# Patient Record
Sex: Male | Born: 1985 | Race: White | Hispanic: No | Marital: Married | Smoking: Current every day smoker
Health system: Southern US, Community
[De-identification: ages and names within clinical notes are randomized; demographics above are authoritative.]

---

## 2003-10-13 ENCOUNTER — Emergency Department (HOSPITAL_COMMUNITY): Admission: EM | Admit: 2003-10-13 | Discharge: 2003-10-14 | Payer: Self-pay | Admitting: Emergency Medicine

## 2012-01-28 ENCOUNTER — Emergency Department (HOSPITAL_COMMUNITY): Payer: Medicaid Other

## 2012-01-28 ENCOUNTER — Emergency Department (HOSPITAL_COMMUNITY)
Admission: EM | Admit: 2012-01-28 | Discharge: 2012-01-29 | Disposition: A | Discharge status: Home / Self Care | Payer: Medicaid Other | Attending: Emergency Medicine | Admitting: Emergency Medicine

## 2012-01-28 ENCOUNTER — Encounter (HOSPITAL_COMMUNITY): Payer: Self-pay | Admitting: *Deleted

## 2012-01-28 DIAGNOSIS — F17200 Nicotine dependence, unspecified, uncomplicated: Secondary | ICD-10-CM | POA: Insufficient documentation

## 2012-01-28 DIAGNOSIS — S43109A Unspecified dislocation of unspecified acromioclavicular joint, initial encounter: Secondary | ICD-10-CM | POA: Insufficient documentation

## 2012-01-28 DIAGNOSIS — W1789XA Other fall from one level to another, initial encounter: Secondary | ICD-10-CM | POA: Insufficient documentation

## 2012-01-28 MED ORDER — HYDROCODONE-ACETAMINOPHEN 5-325 MG PO TABS
1.0000 | ORAL_TABLET | Freq: Once | ORAL | Status: AC
  Administered 2012-01-29: 1 via ORAL
  Filled 2012-01-28: qty 1

## 2012-01-28 MED ORDER — HYDROCODONE-ACETAMINOPHEN 5-325 MG PO TABS: 1.0000 | ORAL_TABLET | ORAL | Status: AC | PRN
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
## 2012-01-28 NOTE — ED Notes (Signed)
Patient transported to X-ray 
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
## 2012-01-29 NOTE — ED Provider Notes (Signed)
Medical screening examination/treatment/procedure(s) were performed by non-physician practitioner and as supervising physician I was immediately available for consultation/collaboration.  Sunnie Nielsen, MD 01/29/12 [phone]

## 2012-01-29 NOTE — ED Notes (Signed)
Pt ambulated with a steady gait;VSS; A&Ox3; no signs of distress; respirations even and unlabored; skin warm and dry; no questions at this time.  

## 2012-12-30 ENCOUNTER — Emergency Department (HOSPITAL_BASED_OUTPATIENT_CLINIC_OR_DEPARTMENT_OTHER)
Admission: EM | Admit: 2012-12-30 | Discharge: 2012-12-30 | Disposition: A | Discharge status: Home / Self Care | Payer: Self-pay | Attending: Emergency Medicine | Admitting: Emergency Medicine

## 2012-12-30 ENCOUNTER — Emergency Department (HOSPITAL_BASED_OUTPATIENT_CLINIC_OR_DEPARTMENT_OTHER): Payer: Self-pay

## 2012-12-30 ENCOUNTER — Encounter (HOSPITAL_BASED_OUTPATIENT_CLINIC_OR_DEPARTMENT_OTHER): Payer: Self-pay | Admitting: *Deleted

## 2012-12-30 DIAGNOSIS — S60229A Contusion of unspecified hand, initial encounter: Secondary | ICD-10-CM | POA: Insufficient documentation

## 2012-12-30 DIAGNOSIS — IMO0002 Reserved for concepts with insufficient information to code with codable children: Secondary | ICD-10-CM | POA: Insufficient documentation

## 2012-12-30 DIAGNOSIS — Z79899 Other long term (current) drug therapy: Secondary | ICD-10-CM | POA: Insufficient documentation

## 2012-12-30 DIAGNOSIS — S60221A Contusion of right hand, initial encounter: Secondary | ICD-10-CM

## 2012-12-30 DIAGNOSIS — Y9389 Activity, other specified: Secondary | ICD-10-CM | POA: Insufficient documentation

## 2012-12-30 DIAGNOSIS — T07XXXA Unspecified multiple injuries, initial encounter: Secondary | ICD-10-CM | POA: Insufficient documentation

## 2012-12-30 DIAGNOSIS — F17200 Nicotine dependence, unspecified, uncomplicated: Secondary | ICD-10-CM | POA: Insufficient documentation

## 2012-12-30 DIAGNOSIS — Y9289 Other specified places as the place of occurrence of the external cause: Secondary | ICD-10-CM | POA: Insufficient documentation

## 2012-12-30 MED ORDER — OXYCODONE-ACETAMINOPHEN 5-325 MG PO TABS: 1.0000 | ORAL_TABLET | ORAL | Status: DC | PRN

## 2012-12-30 MED ORDER — OXYCODONE-ACETAMINOPHEN 5-325 MG PO TABS
1.0000 | ORAL_TABLET | Freq: Once | ORAL | Status: AC
  Administered 2012-12-30: 1 via ORAL
  Filled 2012-12-30 (×2): qty 1
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
## 2012-12-30 NOTE — ED Notes (Signed)
Splint applied by ED tech pt states pain to wrist improved
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
## 2013-01-28 ENCOUNTER — Encounter (HOSPITAL_BASED_OUTPATIENT_CLINIC_OR_DEPARTMENT_OTHER): Payer: Self-pay | Admitting: *Deleted

## 2013-01-28 ENCOUNTER — Emergency Department (HOSPITAL_BASED_OUTPATIENT_CLINIC_OR_DEPARTMENT_OTHER)
Admission: EM | Admit: 2013-01-28 | Discharge: 2013-01-28 | Disposition: A | Discharge status: Home / Self Care | Payer: Self-pay | Attending: Emergency Medicine | Admitting: Emergency Medicine

## 2013-01-28 DIAGNOSIS — Z79899 Other long term (current) drug therapy: Secondary | ICD-10-CM | POA: Insufficient documentation

## 2013-01-28 DIAGNOSIS — K029 Dental caries, unspecified: Secondary | ICD-10-CM | POA: Insufficient documentation

## 2013-01-28 DIAGNOSIS — F17200 Nicotine dependence, unspecified, uncomplicated: Secondary | ICD-10-CM | POA: Insufficient documentation

## 2013-01-28 MED ORDER — PENICILLIN V POTASSIUM 250 MG PO TABS
500.0000 mg | ORAL_TABLET | Freq: Once | ORAL | Status: AC
  Administered 2013-01-28: 500 mg via ORAL
  Filled 2013-01-28: qty 2

## 2013-01-28 MED ORDER — PENICILLIN V POTASSIUM 500 MG PO TABS: 500.0000 mg | ORAL_TABLET | Freq: Four times a day (QID) | ORAL | Status: DC

## 2013-01-28 MED ORDER — OXYCODONE-ACETAMINOPHEN 5-325 MG PO TABS
1.0000 | ORAL_TABLET | Freq: Once | ORAL | Status: AC
  Administered 2013-01-28: 1 via ORAL
  Filled 2013-01-28 (×2): qty 1

## 2013-01-28 MED ORDER — OXYCODONE-ACETAMINOPHEN 5-325 MG PO TABS: 1.0000 | ORAL_TABLET | Freq: Four times a day (QID) | ORAL | Status: DC | PRN

## 2013-01-28 NOTE — ED Provider Notes (Signed)
History    CSN: [id number] Arrival date & time 01/28/13  0204  First MD Initiated Contact with Patient 01/28/13 0210     Chief Complaint  Patient presents with  . Dental Pain   (Consider location/radiation/quality/duration/timing/severity/associated sxs/prior Treatment) Patient is a 27 y.o. male presenting with tooth pain. The history is provided by the patient. No language interpreter was used.  Dental Pain Location:  Lower Lower teeth location:  21/LL 1st bicuspid, 20/LL 2nd bicuspid, 19/LL 1st molar and 17/LL 3rd molar Quality:  Dull Severity:  Moderate Onset quality:  Gradual Timing:  Constant Progression:  Worsening Chronicity:  Recurrent Context: dental fracture and poor dentition   Previous work-up:  Filled cavity Relieved by:  Nothing Worsened by:  Nothing tried Ineffective treatments:  None tried Associated symptoms: no fever   Risk factors: no diabetes    History reviewed. No pertinent past medical history. History reviewed. No pertinent past surgical history. No family history on file. History  Substance Use Topics  . Smoking status: Current Every Day Smoker  . Smokeless tobacco: Not on file  . Alcohol Use: No    Review of Systems  Constitutional: Negative for fever.  All other systems reviewed and are negative.    Allergies  Review of patient's allergies indicates no active allergies.  Home Medications   Current Outpatient Rx  Name  Route  Sig  Dispense  Refill  . BusPIRone HCl (BUSPAR PO)   Oral   Take 1 tablet by mouth 4 (four) times daily - after meals and at bedtime.         Marland Kitchen oxyCODONE-acetaminophen (PERCOCET) 5-325 MG per tablet   Oral   Take 1 tablet by mouth every 6 (six) hours as needed for pain.   10 tablet   0   . oxyCODONE-acetaminophen (PERCOCET/ROXICET) 5-325 MG per tablet   Oral   Take 1 tablet by mouth every 4 (four) hours as needed for pain.   20 tablet   0   . penicillin v potassium (VEETID) 500 MG tablet   Oral   Take 1 tablet (500 mg total) by mouth 4 (four) times daily.   40 tablet   0   . QUEtiapine (SEROQUEL) 25 MG tablet   Oral   Take 25 mg by mouth at bedtime.          BP 155/95  Pulse 87  Temp(Src) 98.1 F (36.7 C) (Oral)  Ht 5\' 10"  (1.778 m)  Wt 190 lb (86.183 kg)  BMI 27.26 kg/m2  SpO2 97% Physical Exam  Constitutional: He is oriented to person, place, and time. He appears well-developed and well-nourished. No distress.  HENT:  Head: Normocephalic and atraumatic.  Mouth/Throat: Oropharynx is clear and moist.    Eyes: Conjunctivae are normal. Pupils are equal, round, and reactive to light.  Neck: Normal range of motion. Neck supple.  Cardiovascular: Normal rate, regular rhythm and intact distal pulses.   Pulmonary/Chest: Effort normal and breath sounds normal. He has no wheezes. He has no rales.  Abdominal: Soft. Bowel sounds are normal. There is no tenderness. There is no rebound and no guarding.  Musculoskeletal: Normal range of motion.  Neurological: He is alert and oriented to person, place, and time.  Skin: Skin is warm and dry.  Psychiatric: He has a normal mood and affect.    ED Course  Procedures (including critical care time) Labs Reviewed - No data to display No results found. 1. Dental caries     MDM  Antibiotics, pain meds follow up for extractions  Marsela Kuan K Ardell Makarewicz-Rasch, MD 01/28/13 [id number]

## 2013-01-28 NOTE — ED Notes (Addendum)
C/o right lower tooth pain that started last night after eating. States he "cracked" his tooth. Tooth decay noted on right lower 5  teeth.  Fevers unknown.

## 2013-05-05 ENCOUNTER — Emergency Department (HOSPITAL_BASED_OUTPATIENT_CLINIC_OR_DEPARTMENT_OTHER)
Admission: EM | Admit: 2013-05-05 | Discharge: 2013-05-05 | Disposition: A | Discharge status: Home / Self Care | Payer: Self-pay | Attending: Emergency Medicine | Admitting: Emergency Medicine

## 2013-05-05 ENCOUNTER — Encounter (HOSPITAL_BASED_OUTPATIENT_CLINIC_OR_DEPARTMENT_OTHER): Payer: Self-pay | Admitting: Emergency Medicine

## 2013-05-05 ENCOUNTER — Emergency Department (HOSPITAL_BASED_OUTPATIENT_CLINIC_OR_DEPARTMENT_OTHER): Payer: Medicaid Other

## 2013-05-05 DIAGNOSIS — M549 Dorsalgia, unspecified: Secondary | ICD-10-CM

## 2013-05-05 DIAGNOSIS — R350 Frequency of micturition: Secondary | ICD-10-CM | POA: Insufficient documentation

## 2013-05-05 DIAGNOSIS — R300 Dysuria: Secondary | ICD-10-CM | POA: Insufficient documentation

## 2013-05-05 DIAGNOSIS — M545 Low back pain, unspecified: Secondary | ICD-10-CM | POA: Insufficient documentation

## 2013-05-05 DIAGNOSIS — F17200 Nicotine dependence, unspecified, uncomplicated: Secondary | ICD-10-CM | POA: Insufficient documentation

## 2013-05-05 DIAGNOSIS — R319 Hematuria, unspecified: Secondary | ICD-10-CM | POA: Insufficient documentation

## 2013-05-05 LAB — URINE MICROSCOPIC-ADD ON

## 2013-05-05 LAB — URINALYSIS, ROUTINE W REFLEX MICROSCOPIC
Bilirubin Urine: NEGATIVE
Glucose, UA: NEGATIVE mg/dL
Ketones, ur: NEGATIVE mg/dL
Leukocytes, UA: NEGATIVE
Nitrite: NEGATIVE
Protein, ur: NEGATIVE mg/dL
Specific Gravity, Urine: 1.013 (ref 1.005–1.030)
Urobilinogen, UA: 0.2 mg/dL (ref 0.0–1.0)
pH: 6 (ref 5.0–8.0)

## 2013-05-05 MED ORDER — ONDANSETRON HCL 4 MG/2ML IJ SOLN: 4.0000 mg | Freq: Once | INTRAMUSCULAR | Status: DC

## 2013-05-05 MED ORDER — OXYCODONE-ACETAMINOPHEN 5-325 MG PO TABS
1.0000 | ORAL_TABLET | Freq: Once | ORAL | Status: AC
  Administered 2013-05-05: 1 via ORAL
  Filled 2013-05-05: qty 1

## 2013-05-05 MED ORDER — ONDANSETRON 8 MG PO TBDP
8.0000 mg | ORAL_TABLET | Freq: Once | ORAL | Status: AC
  Administered 2013-05-05: 8 mg via ORAL
  Filled 2013-05-05: qty 1

## 2013-05-05 MED ORDER — METHOCARBAMOL 500 MG PO TABS: 500.0000 mg | ORAL_TABLET | Freq: Four times a day (QID) | ORAL | Status: AC | PRN

## 2013-05-05 MED ORDER — MORPHINE SULFATE 4 MG/ML IJ SOLN: 4.0000 mg | Freq: Once | INTRAMUSCULAR | Status: DC

## 2013-05-05 NOTE — ED Provider Notes (Signed)
Medical screening examination/treatment/procedure(s) were performed by non-physician practitioner and as supervising physician I was immediately available for consultation/collaboration.  EKG Interpretation   None         Dustin Lee. Oletta Lamas, MD 05/05/13 2007

## 2013-05-05 NOTE — ED Provider Notes (Signed)
CSN: [id number]     Arrival date & time 05/05/13  1739 History   First MD Initiated Contact with Patient 05/05/13 1811     Chief Complaint  Patient presents with  . Back Pain   (Consider location/radiation/quality/duration/timing/severity/associated sxs/prior Treatment) HPI Pt reports bilateral lower back pain that began yesterday and burning with urination and urinary urgency that began today.  Notes dark urine but denies hematuria.  Denies fevers, chills, body aches, abdominal pain, rectal pain, changes in bowel habit, testicular pain or swelling.  Has had these symptoms previously and was told he had a UTI.  Denies penile discharge or any possibility of STD.    History reviewed. No pertinent past medical history. History reviewed. No pertinent past surgical history. No family history on file. History  Substance Use Topics  . Smoking status: Current Every Day Smoker -- 1.00 packs/day    Types: Cigarettes  . Smokeless tobacco: Not on file  . Alcohol Use: No    Review of Systems  Constitutional: Negative for fever and chills.  Respiratory: Negative for cough and shortness of breath.   Cardiovascular: Negative for chest pain.  Gastrointestinal: Negative for nausea, vomiting, abdominal pain and diarrhea.  Genitourinary: Positive for dysuria and frequency. Negative for urgency.  Musculoskeletal: Positive for back pain. Negative for myalgias.  Skin: Negative for rash.    Allergies  Review of patient's allergies indicates no active allergies.  Home Medications  No current outpatient prescriptions on file. BP 131/75  Pulse 94  Temp(Src) 98.8 F (37.1 C) (Oral)  Resp 18  Wt 180 lb (81.647 kg)  BMI 25.83 kg/m2  SpO2 99% Physical Exam  Nursing note and vitals reviewed. Constitutional: He appears well-developed and well-nourished. No distress.  HENT:  Head: Normocephalic and atraumatic.  Neck: Neck supple.  Cardiovascular: Normal rate and regular rhythm.   Pulmonary/Chest:  Effort normal and breath sounds normal. No respiratory distress. He has no wheezes. He has no rales.  Abdominal: Soft. He exhibits no distension and no mass. There is tenderness. There is CVA tenderness (bilateral). There is no rebound and no guarding.  Diffuse lower abdominal tenderness L>R  Musculoskeletal:       Arms: Neurological: He is alert. He exhibits normal muscle tone.  Skin: He is not diaphoretic.    ED Course  Procedures (including critical care time) Labs Review Labs Reviewed  URINALYSIS, ROUTINE W REFLEX MICROSCOPIC - Abnormal; Notable for the following:    Hgb urine dipstick LARGE (*)    All other components within normal limits  URINE MICROSCOPIC-ADD ON   Imaging Review Ct Abdomen Pelvis Wo Contrast  05/05/2013   CLINICAL DATA:  Low back pain, hematuria.  EXAM: CT ABDOMEN AND PELVIS WITHOUT CONTRAST  TECHNIQUE: Multidetector CT imaging of the abdomen and pelvis was performed following the standard protocol without intravenous contrast.  COMPARISON:  None.  FINDINGS: Visualized lung bases appear normal. The liver, spleen and pancreas appear normal. No gallstones are noted. Adrenal glands and kidneys appear normal. No hydronephrosis or renal obstruction is noted. No renal or ureteral calculi are noted. The appendix appears normal. No evidence of bowel obstruction is noted. No abnormal fluid collection is noted. Urinary bladder appears normal.  IMPRESSION: No acute abnormality seen in the abdomen or pelvis.   Electronically Signed   By: Roque Lias M.D.   On: 05/05/2013 19:07    EKG Interpretation   None      Declines STD testing.   MDM   1. Back pain  2. Hematuria    Pt with low back pain and hematuria that began last night.  Pt is tender throughout low and mid back, worse with movement, reproducible with palpation.  Hematuria from unknown etiology.  No e/o stone.  Pt d/c home with urology f/u.  Robaxin for presumed musculoskeletal back pain.  Discussed result,  findings, treatment, and follow up  with patient.  Pt given return precautions.  Pt verbalizes understanding and agrees with plan.        Trixie Dredge, PA-C 05/05/13 2005

## 2013-05-05 NOTE — ED Notes (Signed)
Patient here with lower back pain and hematuria since last pm. Reports also had decreased urination for same, no hx of stones.

## 2013-06-22 ENCOUNTER — Encounter (HOSPITAL_BASED_OUTPATIENT_CLINIC_OR_DEPARTMENT_OTHER): Payer: Self-pay | Admitting: Emergency Medicine

## 2013-06-22 ENCOUNTER — Emergency Department (HOSPITAL_BASED_OUTPATIENT_CLINIC_OR_DEPARTMENT_OTHER)
Admission: EM | Admit: 2013-06-22 | Discharge: 2013-06-22 | Disposition: A | Discharge status: Home / Self Care | Payer: Medicaid Other | Attending: Emergency Medicine | Admitting: Emergency Medicine

## 2013-06-22 DIAGNOSIS — N5089 Other specified disorders of the male genital organs: Secondary | ICD-10-CM

## 2013-06-22 DIAGNOSIS — F17200 Nicotine dependence, unspecified, uncomplicated: Secondary | ICD-10-CM | POA: Insufficient documentation

## 2013-06-22 DIAGNOSIS — N508 Other specified disorders of male genital organs: Secondary | ICD-10-CM | POA: Insufficient documentation

## 2013-06-22 LAB — GC/CHLAMYDIA PROBE AMP
CT Probe RNA: NEGATIVE
GC Probe RNA: NEGATIVE

## 2013-06-22 MED ORDER — VALACYCLOVIR HCL 1 G PO TABS: 1000.0000 mg | ORAL_TABLET | Freq: Two times a day (BID) | ORAL | Status: DC

## 2013-06-22 MED ORDER — ACYCLOVIR 400 MG PO TABS: 400.0000 mg | ORAL_TABLET | Freq: Three times a day (TID) | ORAL | Status: AC

## 2013-06-22 MED ORDER — VALACYCLOVIR HCL 500 MG PO TABS
1000.0000 mg | ORAL_TABLET | Freq: Once | ORAL | Status: DC
  Filled 2013-06-22: qty 2

## 2013-06-22 MED ORDER — HYDROCODONE-ACETAMINOPHEN 5-325 MG PO TABS
2.0000 | ORAL_TABLET | Freq: Once | ORAL | Status: AC
  Administered 2013-06-22: 2 via ORAL
  Filled 2013-06-22: qty 2

## 2013-06-22 MED ORDER — HYDROCODONE-ACETAMINOPHEN 5-325 MG PO TABS: 1.0000 | ORAL_TABLET | Freq: Four times a day (QID) | ORAL | Status: AC | PRN

## 2013-06-22 NOTE — ED Notes (Signed)
MD at bedside. 

## 2013-06-22 NOTE — ED Notes (Signed)
Pt reports yesterday noticed a 'bump' on the base of his penis, is now raw and painful with several 'bump' areas.

## 2013-06-22 NOTE — ED Provider Notes (Signed)
CSN: [id number]     Arrival date & time 06/22/13  0520 History   First MD Initiated Contact with Patient 06/22/13 0529     Chief Complaint  Patient presents with  . Penis Pain   (Consider location/radiation/quality/duration/timing/severity/associated sxs/prior Treatment) HPI This is a 27 year old male with a new sexual partner for about one month. He is here with about a 24-hour history of severely painful bumps primary at the base of his penis with a few smaller lesions near the glans. They're not vesicular. There hurting enough to keep him from sleeping. There is no associated urethral discharge, dysuria, drainage or lymphadenopathy. He has a history of herpes simplex virus. His significant other has a remote history of cold sore but no recent active lesion. He denies fever, chills, nausea, vomiting or abdominal pain.   History reviewed. No pertinent past medical history. History reviewed. No pertinent past surgical history. No family history on file. History  Substance Use Topics  . Smoking status: Current Every Day Smoker -- 1.50 packs/day    Types: Cigarettes  . Smokeless tobacco: Not on file  . Alcohol Use: No    Review of Systems  All other systems reviewed and are negative.    Allergies  Review of patient's allergies indicates no known allergies.  Home Medications   Current Outpatient Rx  Name  Route  Sig  Dispense  Refill  . methocarbamol (ROBAXIN) 500 MG tablet   Oral   Take 1 tablet (500 mg total) by mouth 4 (four) times daily as needed (pain).   20 tablet   0    BP 135/99  Temp(Src) 98.5 F (36.9 C) (Oral)  Resp 18  Ht 5\' 10"  (1.778 m)  Wt 180 lb (81.647 kg)  BMI 25.83 kg/m2  Physical Exam General: Well-developed, well-nourished male in no acute distress; appearance consistent with age of record HENT: normocephalic; atraumatic Eyes: pupils equal, round and reactive to light; extraocular muscles intact Neck: supple Heart: regular rate and  rhythm Lungs: clear to auscultation bilaterally Abdomen: soft; nondistended; nontender; no masses or hepatosplenomegaly; bowel sounds present GU: Tanner 4 male, circumcised; mild bilateral testicular tenderness without mass or swelling; several maculopapular lesions of the base of the penis which are profoundly tender, 2 crusted lesions near the glans; no inguinal lymphadenopathy Extremities: No deformity; full range of motion Neurologic: Awake, alert and oriented; motor function intact in all extremities and symmetric; no facial droop Skin: Warm and dry Psychiatric: Anxious    ED Course  Procedures (including critical care time)  MDM  Although the rash is not classic for genital herpes we will go ahead and start Valtrex as a precaution pending STD and viral cultures.     Hanley Seamen, MD 06/22/13 [phone]

## 2013-06-24 ENCOUNTER — Encounter (HOSPITAL_BASED_OUTPATIENT_CLINIC_OR_DEPARTMENT_OTHER): Payer: Self-pay | Admitting: Emergency Medicine

## 2013-06-24 ENCOUNTER — Emergency Department (HOSPITAL_BASED_OUTPATIENT_CLINIC_OR_DEPARTMENT_OTHER)
Admission: EM | Admit: 2013-06-24 | Discharge: 2013-06-24 | Disposition: A | Discharge status: Home / Self Care | Payer: Medicaid Other | Attending: Emergency Medicine | Admitting: Emergency Medicine

## 2013-06-24 DIAGNOSIS — Z79899 Other long term (current) drug therapy: Secondary | ICD-10-CM | POA: Insufficient documentation

## 2013-06-24 DIAGNOSIS — F17200 Nicotine dependence, unspecified, uncomplicated: Secondary | ICD-10-CM | POA: Insufficient documentation

## 2013-06-24 DIAGNOSIS — N489 Disorder of penis, unspecified: Secondary | ICD-10-CM

## 2013-06-24 DIAGNOSIS — N4889 Other specified disorders of penis: Secondary | ICD-10-CM | POA: Insufficient documentation

## 2013-06-24 DIAGNOSIS — T7840XA Allergy, unspecified, initial encounter: Secondary | ICD-10-CM

## 2013-06-24 DIAGNOSIS — T4275XA Adverse effect of unspecified antiepileptic and sedative-hypnotic drugs, initial encounter: Secondary | ICD-10-CM | POA: Insufficient documentation

## 2013-06-24 DIAGNOSIS — R21 Rash and other nonspecific skin eruption: Secondary | ICD-10-CM | POA: Insufficient documentation

## 2013-06-24 LAB — URINALYSIS, ROUTINE W REFLEX MICROSCOPIC
Glucose, UA: NEGATIVE mg/dL
Leukocytes, UA: NEGATIVE
pH: 7 (ref 5.0–8.0)

## 2013-06-24 MED ORDER — MELOXICAM 7.5 MG PO TABS: 7.5000 mg | ORAL_TABLET | Freq: Every day | ORAL | Status: AC

## 2013-06-24 MED ORDER — DIPHENHYDRAMINE HCL 25 MG PO CAPS
25.0000 mg | ORAL_CAPSULE | Freq: Once | ORAL | Status: AC
  Administered 2013-06-24: 25 mg via ORAL
  Filled 2013-06-24: qty 1

## 2013-06-24 MED ORDER — KETOROLAC TROMETHAMINE 10 MG PO TABS
10.0000 mg | ORAL_TABLET | Freq: Once | ORAL | Status: AC
  Administered 2013-06-24: 10 mg via ORAL
  Filled 2013-06-24: qty 1

## 2013-06-24 NOTE — ED Provider Notes (Signed)
No leukocytes on urinalysis to suggest UTI or epidydymitis.  Patient sleeping in room on multiple evals.  Patient belligerent at discharge stating, " First you all tell me I'm allergic to codeine.  Then you tell me I'm allergic to tylenol, now you're giving me pain pills with tylenol and I can't sleep."  EDP explained he had not been told by anyone that he was allergic to Tylenol in this ED and that [facility] stated he had not been even registered at their Hospital as a patient today or since October.  Informed by Vivi Martens, nurse, that patient refused to sign because what he wanted with [facility].    Jasmine Awe, MD 06/24/13 [phone]

## 2013-06-24 NOTE — ED Provider Notes (Signed)
CSN: [id number]     Arrival date & time 06/24/13  0142 History   First MD Initiated Contact with Patient 06/24/13 [phone]     Chief Complaint  Patient presents with  . Allergic Reaction   (Consider location/radiation/quality/duration/timing/severity/associated sxs/prior Treatment) Patient is a 27 y.o. male presenting with allergic reaction. The history is provided by the patient.  Allergic Reaction Presenting symptoms: rash   Severity:  Mild Prior allergic episodes:  No prior episodes Context: medications   Context comment:  Says the norco he was given for pain is causing the reaction.  Needs stronger pain medication Relieved by:  Nothing Worsened by:  Nothing tried Ineffective treatments:  None tried States he was seen at [facility] today and nothing was done and he was told to come to [facility] for further care of his pain and allergic reaction as this is where he was last seen  History reviewed. No pertinent past medical history. History reviewed. No pertinent past surgical history. History reviewed. No pertinent family history. History  Substance Use Topics  . Smoking status: Current Every Day Smoker -- 1.50 packs/day    Types: Cigarettes  . Smokeless tobacco: Not on file  . Alcohol Use: No    Review of Systems  Constitutional: Negative for fever.  Genitourinary: Positive for genital sores and testicular pain.  Skin: Positive for rash.  All other systems reviewed and are negative.    Allergies  Review of patient's allergies indicates no known allergies.  Home Medications   Current Outpatient Rx  Name  Route  Sig  Dispense  Refill  . acyclovir (ZOVIRAX) 400 MG tablet   Oral   Take 1 tablet (400 mg total) by mouth 3 (three) times daily.   21 tablet   0   . HYDROcodone-acetaminophen (NORCO/VICODIN) 5-325 MG per tablet   Oral   Take 1-2 tablets by mouth every 6 (six) hours as needed for moderate pain.   20 tablet   0   . methocarbamol (ROBAXIN) 500 MG tablet  Oral   Take 1 tablet (500 mg total) by mouth 4 (four) times daily as needed (pain).   20 tablet   0    BP 137/73  Pulse 96  Temp(Src) 97.7 F (36.5 C) (Oral)  Resp 18  SpO2 98% Physical Exam  Constitutional: He is oriented to person, place, and time. He appears well-developed and well-nourished. No distress.  HENT:  Head: Normocephalic and atraumatic.  Mouth/Throat: Oropharynx is clear and moist.  Eyes: Conjunctivae are normal. Pupils are equal, round, and reactive to light.  Neck: Normal range of motion. Neck supple.  Cardiovascular: Normal rate and regular rhythm.   Pulmonary/Chest: Effort normal and breath sounds normal. No stridor. He has no wheezes.  Abdominal: Soft. Bowel sounds are normal. There is no tenderness. There is no rebound.  Genitourinary:  Elevated lesion on the lateral aspect of the shaft of the penis.  Not ulcerated.  Chaperone present  Neurological: He is alert and oriented to person, place, and time.  Skin: Skin is warm and dry.  One red macule on the volar aspect of the right forearm    ED Course  Procedures (including critical care time) Labs Review Labs Reviewed  URINALYSIS, ROUTINE W REFLEX MICROSCOPIC   Imaging Review No results found.  EKG Interpretation   None       MDM  No diagnosis found. Records from [place] obtained.  Patient did not check in in their system today.  Last seen based on records  10/22.  Will not prescribe further narcotics.  Suspect this is drug seeking behavior.      Jasmine Awe, MD 06/24/13 [phone]

## 2013-06-24 NOTE — ED Notes (Addendum)
MD at bedside. Pt wanting narcotics that do not contain tylenol. Pt was nodding off to sleep during his conversation with the MD and had to be awaken by MD several times. MD explained that she was not prescribing him any narcotics. Pt. was upset by this. I  Attempted to give pt his d/c instructions. Reviewed Rx for Mobic with pt.  Pt. States that "mobic" does not help his pain and pt states he did not want the RX or paperwork and left the building with family.

## 2013-06-24 NOTE — ED Notes (Signed)
Pt reports seen Thursday here at [facility] for possible STD given meds  At that time that he blieves is causing an allergic reation. Pt seen at [facility] at 1100 today who sent him back to [facility] r/t medications received here and possible allergic reaction

## 2013-06-25 LAB — HERPES SIMPLEX VIRUS CULTURE: Culture: NOT DETECTED
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
## 2017-05-30 ENCOUNTER — Other Ambulatory Visit: Payer: Self-pay | Admitting: *Deleted

## 2017-05-30 NOTE — Patient Outreach (Signed)
[facility] Care Management  05/30/2017  Josem KaufmannJames Munce Jan 26, 1986 [id number]  Telephone Screen  Referral Date: 05/30/17 Referral Source: [employer] ([employer]) Referral Reason: DM, Lack of knowledge about conditions. Care Coordination Needed Member is unaware of what several of his meds are for and what his A1C of 6.4 means to his overall health.  Insurance: HTA  Outreach attempt #1 to patient. No answer. Line busy, unable to leave a voicemail message.   Plan: RN CM will contact patient within one week.   Wynelle ClevelandJuanita Athalene Kolle, RN, BSN, MHA/MSL, [facility] Telephonic Care Manager Coordinator [facility] Direct Phone: [phone] Toll Free: [phone] Fax: [phone]

## 2017-05-31 ENCOUNTER — Encounter: Payer: Self-pay | Admitting: *Deleted

## 2017-05-31 NOTE — Telephone Encounter (Signed)
This encounter was created in error - please disregard.

## 2023-01-14 DIAGNOSIS — R079 Chest pain, unspecified: Secondary | ICD-10-CM

## 2024-04-09 LAB — GLUCOSE, POCT (MANUAL RESULT ENTRY): POC Glucose: 74 mg/dL (ref 70–99)
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
